# Patient Record
Sex: Female | Born: 1990 | Race: White | Hispanic: No | Marital: Single | Smoking: Never smoker
Health system: Southern US, Community
[De-identification: ages and names within clinical notes are randomized; demographics above are authoritative.]

## PROBLEM LIST (undated history)

## (undated) DIAGNOSIS — R030 Elevated blood-pressure reading, without diagnosis of hypertension: Secondary | ICD-10-CM

## (undated) DIAGNOSIS — R002 Palpitations: Secondary | ICD-10-CM

## (undated) DIAGNOSIS — R079 Chest pain, unspecified: Secondary | ICD-10-CM

## (undated) DIAGNOSIS — F419 Anxiety disorder, unspecified: Secondary | ICD-10-CM

## (undated) DIAGNOSIS — IMO0001 Reserved for inherently not codable concepts without codable children: Secondary | ICD-10-CM

## (undated) DIAGNOSIS — F329 Major depressive disorder, single episode, unspecified: Secondary | ICD-10-CM

## (undated) DIAGNOSIS — F32A Depression, unspecified: Secondary | ICD-10-CM

## (undated) HISTORY — DX: Major depressive disorder, single episode, unspecified: F32.9

## (undated) HISTORY — DX: Chest pain, unspecified: R07.9

## (undated) HISTORY — DX: Elevated blood-pressure reading, without diagnosis of hypertension: R03.0

## (undated) HISTORY — DX: Depression, unspecified: F32.A

## (undated) HISTORY — DX: Reserved for inherently not codable concepts without codable children: IMO0001

## (undated) HISTORY — DX: Anxiety disorder, unspecified: F41.9

## (undated) HISTORY — DX: Palpitations: R00.2

---

## 2011-09-05 ENCOUNTER — Ambulatory Visit (INDEPENDENT_AMBULATORY_CARE_PROVIDER_SITE_OTHER): Payer: BC Managed Care – PPO | Admitting: Emergency Medicine

## 2011-09-05 ENCOUNTER — Ambulatory Visit: Payer: BC Managed Care – PPO

## 2011-09-05 VITALS — BP 142/98 | HR 67 | Temp 97.8°F | Resp 18 | Ht 65.5 in | Wt 138.8 lb

## 2011-09-05 DIAGNOSIS — M25531 Pain in right wrist: Secondary | ICD-10-CM

## 2011-09-05 DIAGNOSIS — M25539 Pain in unspecified wrist: Secondary | ICD-10-CM

## 2011-09-05 DIAGNOSIS — I10 Essential (primary) hypertension: Secondary | ICD-10-CM

## 2011-09-05 DIAGNOSIS — IMO0001 Reserved for inherently not codable concepts without codable children: Secondary | ICD-10-CM

## 2011-09-05 MED ORDER — MELOXICAM 15 MG PO TABS: 15.0000 mg | ORAL_TABLET | Freq: Every day | ORAL | Status: AC

## 2011-09-05 NOTE — Progress Notes (Signed)
  Subjective:    Patient ID: Gloria Robinson, female    DOB: 07-24-1991, 21 y.o.   MRN: [id number]  HPI patient enters with the chief complaint of pain in her right wrist. Patient has recently started [employer] school and has been using her hands repetitively through the day. She has pain on the ulnar side of her right wrist and some decreased grip strength in that wrist he    Review of Systems she has no other musculoskeletal complaints.     Objective:   Physical Exam physical exam reveals tenderness over the ulnar styloid. She does not have pain with flexion extension or ulnar deviation against resistance. There does appear to be some swelling over the soft tissues distal to the ulnar styloid   [facility] reading (PRIMARY) by  Dr. Cleta Alberts normal.      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 We'll get an x-ray to confirm a normal ulnar styloid. She will probably benefit from nonsteroidals and splint also of note the patient's blood pressure was elevated on her initial vital signs and on repeat. We'll also address this at the time of checkout.

## 2011-09-05 NOTE — Patient Instructions (Addendum)
Wrist Exercises RANGE OF MOTION (ROM) AND STRETCHING EXERCISES - Wrist Sprain  These exercises may help you when beginning to rehabilitate your injury. Your symptoms may resolve with or without further involvement from your physician, physical therapist or athletic trainer. While completing these exercises, remember:    Restoring tissue flexibility helps normal motion to return to the joints. This allows healthier, less painful movement and activity.     An effective stretch should be held for at least 30 seconds.     A stretch should never be painful. You should only feel a gentle lengthening or release in the stretched tissue.  RANGE OF MOTION - Wrist Flexion, Active-Assisted  Extend your right / left elbow with your palm pointing down.*     Gently pull the back of your hand towards you until you feel a gentle stretch on the top of your forearm.     Hold this position for __________ seconds.  Repeat __________ times. Complete this exercise __________ times per day.   *If directed by your physician, physical therapist or athletic trainer, complete this stretch with your elbow bent rather than extended. RANGE OF MOTION - Wrist Extension, Active-Assisted   Extend your right / left elbow and turn your palm upwards.*     Gently pull your palm/fingertips back so your wrist extends and your fingers point more toward the ground.     You should feel a gentle stretch on the inside of your forearm.     Hold this position for __________ seconds.  Repeat __________ times. Complete this exercise __________ times per day. *If directed by your physician, physical therapist or athletic trainer, complete this stretch with your elbow bent, rather than extended. RANGE OF MOTION - Supination, Active   Stand or sit with your elbows at your side. Bend your right / left elbow to 90 degrees.     Turn your palm upward until you feel a gentle stretch on the inside of your forearm.     Hold this position  for __________ seconds. Slowly release and return to the starting position.  Repeat __________ times. Complete this stretch __________ times per day.   RANGE OF MOTION - Pronation, Active   Stand or sit with your elbows at your side. Bend your right / left elbow to 90 degrees.     Turn your palm downward until you feel a gentle stretch on the top of your forearm.     Hold this position for __________ seconds. Slowly release and return to the starting position.  Repeat __________ times. Complete this stretch __________ times per day.   STRENGTHENING EXERCISES  These exercises may help you when beginning to rehabilitate your injury. They may resolve your symptoms with or without further involvement from your physician, physical therapist or athletic trainer. While completing these exercises, remember:    Muscles can gain both the endurance and the strength needed for everyday activities through controlled exercises.     Complete these exercises as instructed by your physician, physical therapist or athletic trainer. Progress the resistance and repetitions only as guided.     You may experience muscle soreness or fatigue, but the pain or discomfort you are trying to eliminate should never worsen during these exercises. If this pain does worsen, stop and make certain you are following the directions exactly. If the pain is still present after adjustments, discontinue the exercise until you can discuss the trouble with your clinician.  STRENGTH - Wrist Flexors  Sit with your right /  left forearm palm-up and fully supported. Your elbow should be resting below the height of your shoulder. Allow your wrist to extend over the edge of the surface.     Loosely holding a __________ weight or a piece of rubber exercise band/tubing, slowly curl your hand up toward your forearm.     Hold this position for __________ seconds. Slowly lower the wrist back to the starting position in a controlled manner.    Repeat __________ times. Complete this exercise __________ times per day.   STRENGTH - Wrist Extensors  Sit with your right / left forearm palm-down and fully supported. Your elbow should be resting below the height of your shoulder. Allow your wrist to extend over the edge of the surface.     Loosely holding a __________ weight or a piece of rubber exercise band/tubing, slowly curl your handup toward your forearm.     Hold this position for __________ seconds. Slowly lower the wrist back to the starting position in a controlled manner.  Repeat __________ times. Complete this exercise __________ times per day.   STRENGTH - Forearm Supinators  Sit with your right / left forearm supported on a table, keeping your elbow below shoulder height. Rest your hand over the edge, palm down.     Gently grip a hammer or a soup ladle.     Without moving your elbow, slowly turn your palm and hand upward to a "thumbs-up" position.     Hold this position for __________ seconds. Slowly return to the starting position.  Repeat __________ times. Complete this exercise __________ times per day.   STRENGTH - Forearm Pronators   Sit with your right / left forearm supported on a table, keeping your elbow below shoulder height. Rest your hand over the edge, palm up.     Gently grip a hammer or a soup ladle.     Without moving your elbow, slowly turn your palm and hand upward to a "thumbs-up" position.     Hold this position for __________ seconds. Slowly return to the starting position.  Repeat __________ times. Complete this exercise __________ times per day.   STRENGTH - Grip  Grasp a tennis ball, a dense sponge, or a large, rolled sock in your hand.     Squeeze as hard as you can without increasing any pain.     Hold this position for __________ seconds. Release your grip slowly.  Repeat __________ times. Complete this exercise __________ times per day.   Document Released: 06/02/2005 Document  Revised: 03/31/2011 Document Reviewed: 10/31/2008 [facility] Patient Information 2012 [place], [place].Hypertension Information As your heart beats, it forces blood through your arteries. This force is your blood pressure. If the pressure is too high, it is called hypertension (HTN) or high blood pressure. HTN is dangerous because you may have it and not know it. High blood pressure may mean that your heart has to work harder to pump blood. Your arteries may be narrow or stiff. The extra work puts you at risk for heart disease, stroke, and other problems.   Blood pressure consists of two numbers, a higher number over a lower, 110/72, for example. It is stated as "110 over 72." The ideal is below 120 for the top number (systolic) and under 80 for the bottom (diastolic).   You should pay close attention to your blood pressure if you have certain conditions such as:  Heart failure.     Prior heart attack.     Diabetes  Chronic kidney disease.     Prior stroke.     Multiple risk factors for heart disease.  To see if you have HTN, your blood pressure should be measured while you are seated with your arm held at the level of the heart. It should be measured at least twice. A one-time elevated blood pressure reading (especially in the Emergency Department) does not mean that you need treatment. There may be conditions in which the blood pressure is different between your right and left arms. It is important to see your caregiver soon for a recheck. Most people have essential hypertension which means that there is not a specific cause. This type of high blood pressure may be lowered by changing lifestyle factors such as:  Stress.     Smoking.    Lack of exercise.     Excessive weight.     Drug/tobacco/alcohol use.     Eating less salt.  Most people do not have symptoms from high blood pressure until it has caused damage to the body. Effective treatment can often prevent, delay or reduce that  damage. TREATMENT   Treatment for high blood pressure, when a cause has been identified, is directed at the cause. There are a large number of medications to treat HTN. These fall into several categories, and your caregiver will help you select the medicines that are best for you. Medications may have side effects. You should review side effects with your caregiver. If your blood pressure stays high after you have made lifestyle changes or started on medicines,    Your medication(s) may need to be changed.     Other problems may need to be addressed.     Be certain you understand your prescriptions, and know how and when to take your medicine.     Be sure to follow up with your caregiver within the time frame advised (usually within two weeks) to have your blood pressure rechecked and to review your medications.     If you are taking more than one medicine to lower your blood pressure, make sure you know how and at what times they should be taken. Taking two medicines at the same time can result in blood pressure that is too low.  Document Released: 09/21/2005 Document Revised: 03/31/2011 Document Reviewed: 09/28/2007 [facility] Patient Information 2012 [place], [place].Wrist Pain Wrist injuries are frequent in adults and children. A sprain is an injury to the ligaments that hold your bones together. A strain is an injury to muscle or muscle cord-like structures (tendons) from stretching or pulling. Generally, when wrists are moderately tender to touch following a fall or injury, a break in the bone (fracture) may be present. Most wrist sprains or strains are better in 3 to 5 days, but complete healing may take several weeks. HOME CARE INSTRUCTIONS    Put ice on the injured area.     Put ice in a plastic bag.     Place a towel between your skin and the bag.     Leave the ice on for 15 to 20 minutes, 3 to 4 times a day, for the first 2 days.     Keep your arm raised above the level of your  heart whenever possible to reduce swelling and pain.     Rest the injured area for at least 48 hours or as directed by your caregiver.     If a splint or elastic bandage has been applied, use it for as long as directed  by your caregiver or until seen by a caregiver for a follow-up exam.     Only take over-the-counter or prescription medicines for pain, discomfort, or fever as directed by your caregiver.     Keep all follow-up appointments. You may need to follow up with a specialist or have follow-up X-rays. Improvement in pain level is not a guarantee that you did not fracture a bone in your wrist. The only way to determine whether or not you have a broken bone is by X-ray.  SEEK IMMEDIATE MEDICAL CARE IF:    Your fingers are swollen, very red, white, or cold and blue.     Your fingers are numb or tingling.     You have increasing pain.     You have difficulty moving your fingers.  MAKE SURE YOU:    Understand these instructions.     Will watch your condition.     Will get help right away if you are not doing well or get worse.  Document Released: 04/28/2005 Document Revised: 03/31/2011 Document Reviewed: 09/09/2010 [facility] Patient Information 2012 [place], [place]. Hypertension As your heart beats, it forces blood through your arteries. This force is your blood pressure. If the pressure is too high, it is called hypertension (HTN) or high blood pressure. HTN is dangerous because you may have it and not know it. High blood pressure may mean that your heart has to work harder to pump blood. Your arteries may be narrow or stiff. The extra work puts you at risk for heart disease, stroke, and other problems.  Blood pressure consists of two numbers, a higher number over a lower, 110/72, for example. It is stated as "110 over 72." The ideal is below 120 for the top number (systolic) and under 80 for the bottom (diastolic). Write down your blood pressure today. You should pay close attention  to your blood pressure if you have certain conditions such as:  Heart failure.   Prior heart attack.   Diabetes   Chronic kidney disease.   Prior stroke.   Multiple risk factors for heart disease.  To see if you have HTN, your blood pressure should be measured while you are seated with your arm held at the level of the heart. It should be measured at least twice. A one-time elevated blood pressure reading (especially in the Emergency Department) does not mean that you need treatment. There may be conditions in which the blood pressure is different between your right and left arms. It is important to see your caregiver soon for a recheck. Most people have essential hypertension which means that there is not a specific cause. This type of high blood pressure may be lowered by changing lifestyle factors such as:  Stress.   Smoking.   Lack of exercise.   Excessive weight.   Drug/tobacco/alcohol use.   Eating less salt.  Most people do not have symptoms from high blood pressure until it has caused damage to the body. Effective treatment can often prevent, delay or reduce that damage. TREATMENT  When a cause has been identified, treatment for high blood pressure is directed at the cause. There are a large number of medications to treat HTN. These fall into several categories, and your caregiver will help you select the medicines that are best for you. Medications may have side effects. You should review side effects with your caregiver. If your blood pressure stays high after you have made lifestyle changes or started on medicines,   Your medication(s)  may need to be changed.   Other problems may need to be addressed.   Be certain you understand your prescriptions, and know how and when to take your medicine.   Be sure to follow up with your caregiver within the time frame advised (usually within two weeks) to have your blood pressure rechecked and to review your medications.   If  you are taking more than one medicine to lower your blood pressure, make sure you know how and at what times they should be taken. Taking two medicines at the same time can result in blood pressure that is too low.  SEEK IMMEDIATE MEDICAL CARE IF:  You develop a severe headache, blurred or changing vision, or confusion.   You have unusual weakness or numbness, or a faint feeling.   You have severe chest or abdominal pain, vomiting, or breathing problems.  MAKE SURE YOU:   Understand these instructions.   Will watch your condition.   Will get help right away if you are not doing well or get worse.  Document Released: 07/19/2005 Document Revised: 03/31/2011 Document Reviewed: 03/08/2008 [facility] Patient Information 2012 [place], [place].

## 2012-10-16 ENCOUNTER — Ambulatory Visit (INDEPENDENT_AMBULATORY_CARE_PROVIDER_SITE_OTHER): Payer: BC Managed Care – PPO | Admitting: Internal Medicine

## 2012-10-16 ENCOUNTER — Encounter: Payer: Self-pay | Admitting: *Deleted

## 2012-10-16 ENCOUNTER — Ambulatory Visit: Payer: BC Managed Care – PPO

## 2012-10-16 VITALS — BP 142/80 | HR 73 | Temp 98.6°F | Resp 16 | Ht 65.0 in | Wt 144.2 lb

## 2012-10-16 DIAGNOSIS — R079 Chest pain, unspecified: Secondary | ICD-10-CM

## 2012-10-16 DIAGNOSIS — R5383 Other fatigue: Secondary | ICD-10-CM

## 2012-10-16 DIAGNOSIS — I498 Other specified cardiac arrhythmias: Secondary | ICD-10-CM

## 2012-10-16 DIAGNOSIS — R001 Bradycardia, unspecified: Secondary | ICD-10-CM

## 2012-10-16 DIAGNOSIS — R5381 Other malaise: Secondary | ICD-10-CM

## 2012-10-16 DIAGNOSIS — R002 Palpitations: Secondary | ICD-10-CM

## 2012-10-16 LAB — POCT CBC
Granulocyte percent: 51 %G (ref 37–80)
HCT, POC: 44 % (ref 37.7–47.9)
Hemoglobin: 14.1 g/dL (ref 12.2–16.2)
MCHC: 32 g/dL (ref 31.8–35.4)
MPV: 9.3 fL (ref 0–99.8)
POC Granulocyte: 3.5 (ref 2–6.9)
POC LYMPH PERCENT: 41.4 %L (ref 10–50)
POC MID %: 7.6 %M (ref 0–12)
RDW, POC: 12.6 %

## 2012-10-16 LAB — COMPREHENSIVE METABOLIC PANEL
ALT: 16 U/L (ref 0–35)
Albumin: 4.6 g/dL (ref 3.5–5.2)
Alkaline Phosphatase: 81 U/L (ref 39–117)
Glucose, Bld: 94 mg/dL (ref 70–99)
Potassium: 4.2 mEq/L (ref 3.5–5.3)
Sodium: 139 mEq/L (ref 135–145)
Total Bilirubin: 0.4 mg/dL (ref 0.3–1.2)
Total Protein: 7.1 g/dL (ref 6.0–8.3)

## 2012-10-16 LAB — LIPID PANEL
Cholesterol: 211 mg/dL — ABNORMAL HIGH (ref 0–200)
LDL Cholesterol: 133 mg/dL — ABNORMAL HIGH (ref 0–99)
Total CHOL/HDL Ratio: 3.8 Ratio
VLDL: 23 mg/dL (ref 0–40)

## 2012-10-16 NOTE — Patient Instructions (Signed)
DASH Diet The DASH diet stands for "Dietary Approaches to Stop Hypertension." It is a healthy eating plan that has been shown to reduce high blood pressure (hypertension) in as little as 14 days, while also possibly providing other significant health benefits. These other health benefits include reducing the risk of breast cancer after menopause and reducing the risk of type 2 diabetes, heart disease, colon cancer, and stroke. Health benefits also include weight loss and slowing kidney failure in patients with chronic kidney disease.  DIET GUIDELINES  Limit salt (sodium). Your diet should contain less than 1500 mg of sodium daily.  Limit refined or processed carbohydrates. Your diet should include mostly whole grains. Desserts and added sugars should be used sparingly.  Include small amounts of heart-healthy fats. These types of fats include nuts, oils, and tub margarine. Limit saturated and trans fats. These fats have been shown to be harmful in the body. CHOOSING FOODS  The following food groups are based on a 2000 calorie diet. See your Registered Dietitian for individual calorie needs. Grains and Grain Products (6 to 8 servings daily)  Eat More Often: Whole-wheat bread, brown rice, whole-grain or wheat pasta, quinoa, popcorn without added fat or salt (air popped).  Eat Less Often: White bread, white pasta, white rice, cornbread. Vegetables (4 to 5 servings daily)  Eat More Often: Fresh, frozen, and canned vegetables. Vegetables may be raw, steamed, roasted, or grilled with a minimal amount of fat.  Eat Less Often/Avoid: Creamed or fried vegetables. Vegetables in a cheese sauce. Fruit (4 to 5 servings daily)  Eat More Often: All fresh, canned (in natural juice), or frozen fruits. Dried fruits without added sugar. One hundred percent fruit juice ( cup [237 mL] daily).  Eat Less Often: Dried fruits with added sugar. Canned fruit in light or heavy syrup. [employer], Fish, and Poultry (2  servings or less daily. One serving is 3 to 4 oz [85-114 g]).  Eat More Often: Ninety percent or leaner ground beef, tenderloin, sirloin. Round cuts of beef, chicken breast, [place] breast. All fish. Grill, bake, or broil your meat. Nothing should be fried.  Eat Less Often/Avoid: Fatty cuts of meat, [place], or chicken leg, thigh, or wing. Fried cuts of meat or fish. Dairy (2 to 3 servings)  Eat More Often: Low-fat or fat-free milk, low-fat plain or light yogurt, reduced-fat or part-skim cheese.  Eat Less Often/Avoid: Milk (whole, 2%).Whole milk yogurt. Full-fat cheeses. Nuts, Seeds, and Legumes (4 to 5 servings per week)  Eat More Often: All without added salt.  Eat Less Often/Avoid: Salted nuts and seeds, canned beans with added salt. Fats and Sweets (limited)  Eat More Often: Vegetable oils, tub margarines without trans fats, sugar-free gelatin. Mayonnaise and salad dressings.  Eat Less Often/Avoid: Coconut oils, palm oils, butter, stick margarine, cream, half and half, cookies, candy, pie. FOR MORE INFORMATION The Dash Diet Eating Plan: www.dashdiet.org Document Released: 07/08/2011 Document Revised: 10/11/2011 Document Reviewed: 07/08/2011 [facility] Patient Information 2013 [place], [place]. Bradycardia Bradycardia is a term for a heart rate (pulse) that, in adults, is slower than 60 beats per minute. A normal rate is 60 to 100 beats per minute. A heart rate below 60 beats per minute may be normal for some adults with healthy hearts. If the rate is too slow, the heart may have trouble pumping the volume of blood the body needs. If the heart rate gets too low, blood flow to the brain may be decreased and may make you feel lightheaded, dizzy,  or faint. The heart has a natural pacemaker in the top of the heart called the SA node (sinoatrial or sinus node). This pacemaker sends out regular electrical signals to the muscle of the heart, telling the heart muscle when to beat (contract). The  electrical signal travels from the upper parts of the heart (atria) through the AV node (atrioventricular node), to the lower chambers of the heart (ventricles). The ventricles squeeze, pumping the blood from your heart to your lungs and to the rest of your body. CAUSES   Problem with the heart's electrical system.  Problem with the heart's natural pacemaker.  Heart disease, damage, or infection.  Medications.  Problems with minerals and salts (electrolytes). SYMPTOMS   Fainting (syncope).  Fatigue and weakness.  Shortness of breath (dyspnea).  Chest pain (angina).  Drowsiness.  Confusion. DIAGNOSIS   An electrocardiogram (ECG) can help your caregiver determine the type of slow heart rate you have.  If the cause is not seen on an ECG, you may need to wear a heart monitor that records your heart rhythm for several hours or days.  Blood tests. TREATMENT   Electrolyte supplements.  Medications.  Withholding medication which is causing a slow heart rate.  Pacemaker placement. SEEK IMMEDIATE MEDICAL CARE IF:   You feel lightheaded or faint.  You develop an irregular heart rate.  You feel chest pain or have trouble breathing. MAKE SURE YOU:   Understand these instructions.  Will watch your condition.  Will get help right away if you are not doing well or get worse. Document Released: 04/10/2002 Document Revised: 10/11/2011 Document Reviewed: 03/06/2008 [facility] Patient Information 2013 [place], [place]. Hypertension As your heart beats, it forces blood through your arteries. This force is your blood pressure. If the pressure is too high, it is called hypertension (HTN) or high blood pressure. HTN is dangerous because you may have it and not know it. High blood pressure may mean that your heart has to work harder to pump blood. Your arteries may be narrow or stiff. The extra work puts you at risk for heart disease, stroke, and other problems.  Blood pressure  consists of two numbers, a higher number over a lower, 110/72, for example. It is stated as "110 over 72." The ideal is below 120 for the top number (systolic) and under 80 for the bottom (diastolic). Write down your blood pressure today. You should pay close attention to your blood pressure if you have certain conditions such as:  Heart failure.  Prior heart attack.  Diabetes  Chronic kidney disease.  Prior stroke.  Multiple risk factors for heart disease. To see if you have HTN, your blood pressure should be measured while you are seated with your arm held at the level of the heart. It should be measured at least twice. A one-time elevated blood pressure reading (especially in the Emergency Department) does not mean that you need treatment. There may be conditions in which the blood pressure is different between your right and left arms. It is important to see your caregiver soon for a recheck. Most people have essential hypertension which means that there is not a specific cause. This type of high blood pressure may be lowered by changing lifestyle factors such as:  Stress.  Smoking.  Lack of exercise.  Excessive weight.  Drug/tobacco/alcohol use.  Eating less salt. Most people do not have symptoms from high blood pressure until it has caused damage to the body. Effective treatment can often prevent, delay or  reduce that damage. TREATMENT  When a cause has been identified, treatment for high blood pressure is directed at the cause. There are a large number of medications to treat HTN. These fall into several categories, and your caregiver will help you select the medicines that are best for you. Medications may have side effects. You should review side effects with your caregiver. If your blood pressure stays high after you have made lifestyle changes or started on medicines,   Your medication(s) may need to be changed.  Other problems may need to be addressed.  Be certain you  understand your prescriptions, and know how and when to take your medicine.  Be sure to follow up with your caregiver within the time frame advised (usually within two weeks) to have your blood pressure rechecked and to review your medications.  If you are taking more than one medicine to lower your blood pressure, make sure you know how and at what times they should be taken. Taking two medicines at the same time can result in blood pressure that is too low. SEEK IMMEDIATE MEDICAL CARE IF:  You develop a severe headache, blurred or changing vision, or confusion.  You have unusual weakness or numbness, or a faint feeling.  You have severe chest or abdominal pain, vomiting, or breathing problems. MAKE SURE YOU:   Understand these instructions.  Will watch your condition.  Will get help right away if you are not doing well or get worse. Document Released: 07/19/2005 Document Revised: 10/11/2011 Document Reviewed: 03/08/2008 [facility] Patient Information 2013 [place], [place].

## 2012-10-16 NOTE — Progress Notes (Signed)
  Subjective:    Patient ID: Gloria Robinson, female    DOB: [birth year], 22 y.o.   MRN: [id number]  HPI C/O off and on chest pain with palpitations , diaphoresis, and arm numbness. Spells started while jogging but now come at rest and usually last up to 5 minutes. No dizzyness, blackouts, nausea,or weakness. Is able to continue jogging. 1st attack occurred 1 week ago. Has been jogging or 3 years. No fhx of sudden death, GF died of MI, was older. No young people sudden death or heart disease. FHX of HTN and she has had 2 visits with mild systolic elevation   Review of Systems Depression and BP and anxiety    Objective:   Physical Exam  Vitals reviewed. Constitutional: She is oriented to person, place, and time. She appears well-developed and well-nourished. No distress.  HENT:  Mouth/Throat: Oropharynx is clear and moist.  Eyes: Conjunctivae and EOM are normal. Pupils are equal, round, and reactive to light.  Neck: Normal range of motion. No thyromegaly present.  Cardiovascular: Regular rhythm and normal heart sounds.  Bradycardia present.   Pulmonary/Chest: Effort normal and breath sounds normal.  Abdominal: Soft.  Musculoskeletal: Normal range of motion.  Lymphadenopathy:    She has no cervical adenopathy.  Neurological: She is alert and oriented to person, place, and time. No cranial nerve deficit. Coordination normal.  Skin: Skin is warm and dry.  Psychiatric: She has a normal mood and affect.   1240/84  128/80 EKG sinus bradycardia at 43 Exercised and rate rose to 118 and slowly came down.   [facility] reading (PRIMARY) by  Dr.Guest no pneumothorax, normal exam Labs drawn       Assessment & Plan:  New chest pain/diaphoresis, Sinus bradycardia, short PR interval Refer for cardiac evaluation

## 2012-11-10 ENCOUNTER — Ambulatory Visit: Payer: Self-pay | Admitting: Cardiology

## 2012-11-30 ENCOUNTER — Encounter: Payer: Self-pay | Admitting: Cardiology

## 2012-11-30 ENCOUNTER — Encounter: Payer: Self-pay | Admitting: *Deleted

## 2012-11-30 DIAGNOSIS — R079 Chest pain, unspecified: Secondary | ICD-10-CM | POA: Insufficient documentation

## 2012-11-30 DIAGNOSIS — R002 Palpitations: Secondary | ICD-10-CM | POA: Insufficient documentation

## 2012-11-30 DIAGNOSIS — F419 Anxiety disorder, unspecified: Secondary | ICD-10-CM | POA: Insufficient documentation

## 2012-11-30 DIAGNOSIS — F32A Depression, unspecified: Secondary | ICD-10-CM | POA: Insufficient documentation

## 2012-11-30 DIAGNOSIS — F329 Major depressive disorder, single episode, unspecified: Secondary | ICD-10-CM | POA: Insufficient documentation

## 2012-12-04 ENCOUNTER — Encounter: Payer: Self-pay | Admitting: Cardiology

## 2012-12-04 NOTE — Progress Notes (Signed)
  HPI: 22 year old female for evaluation of palpitations. Laboratories in March of 2014 showed a normal hemoglobin, normal potassium, normal renal function, normal liver functions, and TSH of 1.557. Chest x-ray negative.  No current outpatient prescriptions on file.   No current facility-administered medications for this visit.    No Known Allergies  Past Medical History  Diagnosis Date  . Anxiety   . Depression   . Elevated BP   . Chest pain   . Palpitations     No past surgical history on file.  History   Social History  . Marital Status: Single    Spouse Name: N/A    Number of Children: N/A  . Years of Education: N/A   Occupational History  . Not on file.   Social History Main Topics  . Smoking status: Never Smoker   . Smokeless tobacco: Not on file  . Alcohol Use: 2.4 oz/week    4 Cans of beer per week  . Drug Use: No  . Sexually Active: Yes    Birth Control/ Protection: None   Other Topics Concern  . Not on file   Social History Narrative  . No narrative on file    Family History  Problem Relation Age of Onset  . Hypertension Father   . Hypertension Sister   . Heart disease Paternal Grandfather     ROS: no fevers or chills, productive cough, hemoptysis, dysphasia, odynophagia, melena, hematochezia, dysuria, hematuria, rash, seizure activity, orthopnea, PND, pedal edema, claudication. Remaining systems are negative.  Physical Exam:   There were no vitals taken for this visit.  General:  Well developed/well nourished in NAD Skin warm/dry Patient not depressed No peripheral clubbing Back-normal HEENT-normal/normal eyelids Neck supple/normal carotid upstroke bilaterally; no bruits; no JVD; no thyromegaly chest - CTA/ normal expansion CV - RRR/normal S1 and S2; no murmurs, rubs or gallops;  PMI nondisplaced Abdomen -NT/ND, no HSM, no mass, + bowel sounds, no bruit 2+ femoral pulses, no bruits Ext-no edema, chords, 2+ DP Neuro-grossly  nonfocal  ECG 10/16/2012-marked sinus bradycardia at a rate of 43. No ST changes.   This encounter was created in error - please disregard.

## 2013-01-24 IMAGING — CR DG WRIST COMPLETE 3+V*R*
3 series · 3 of 3 positions shown · non-contrast
Comparison: None.

CLINICAL DATA: Right wrist pain.

RIGHT WRIST - COMPLETE 3+ VIEW

[PA]
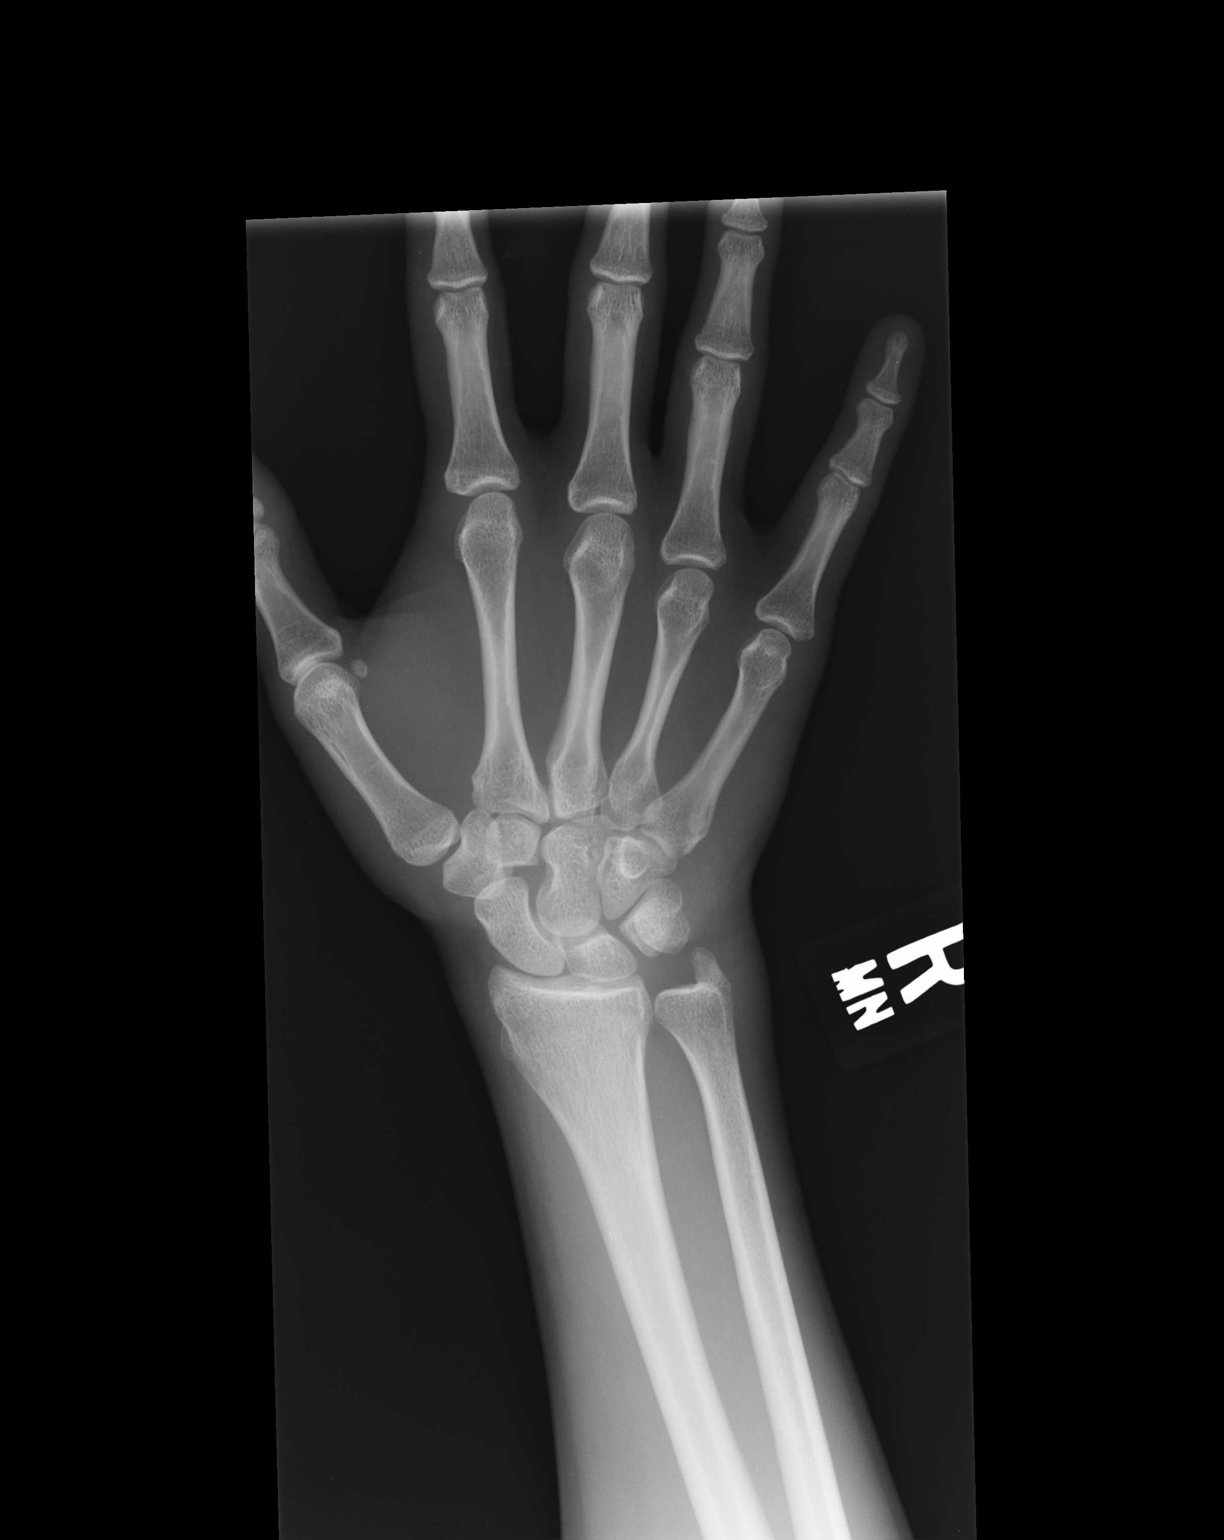

[lateral]
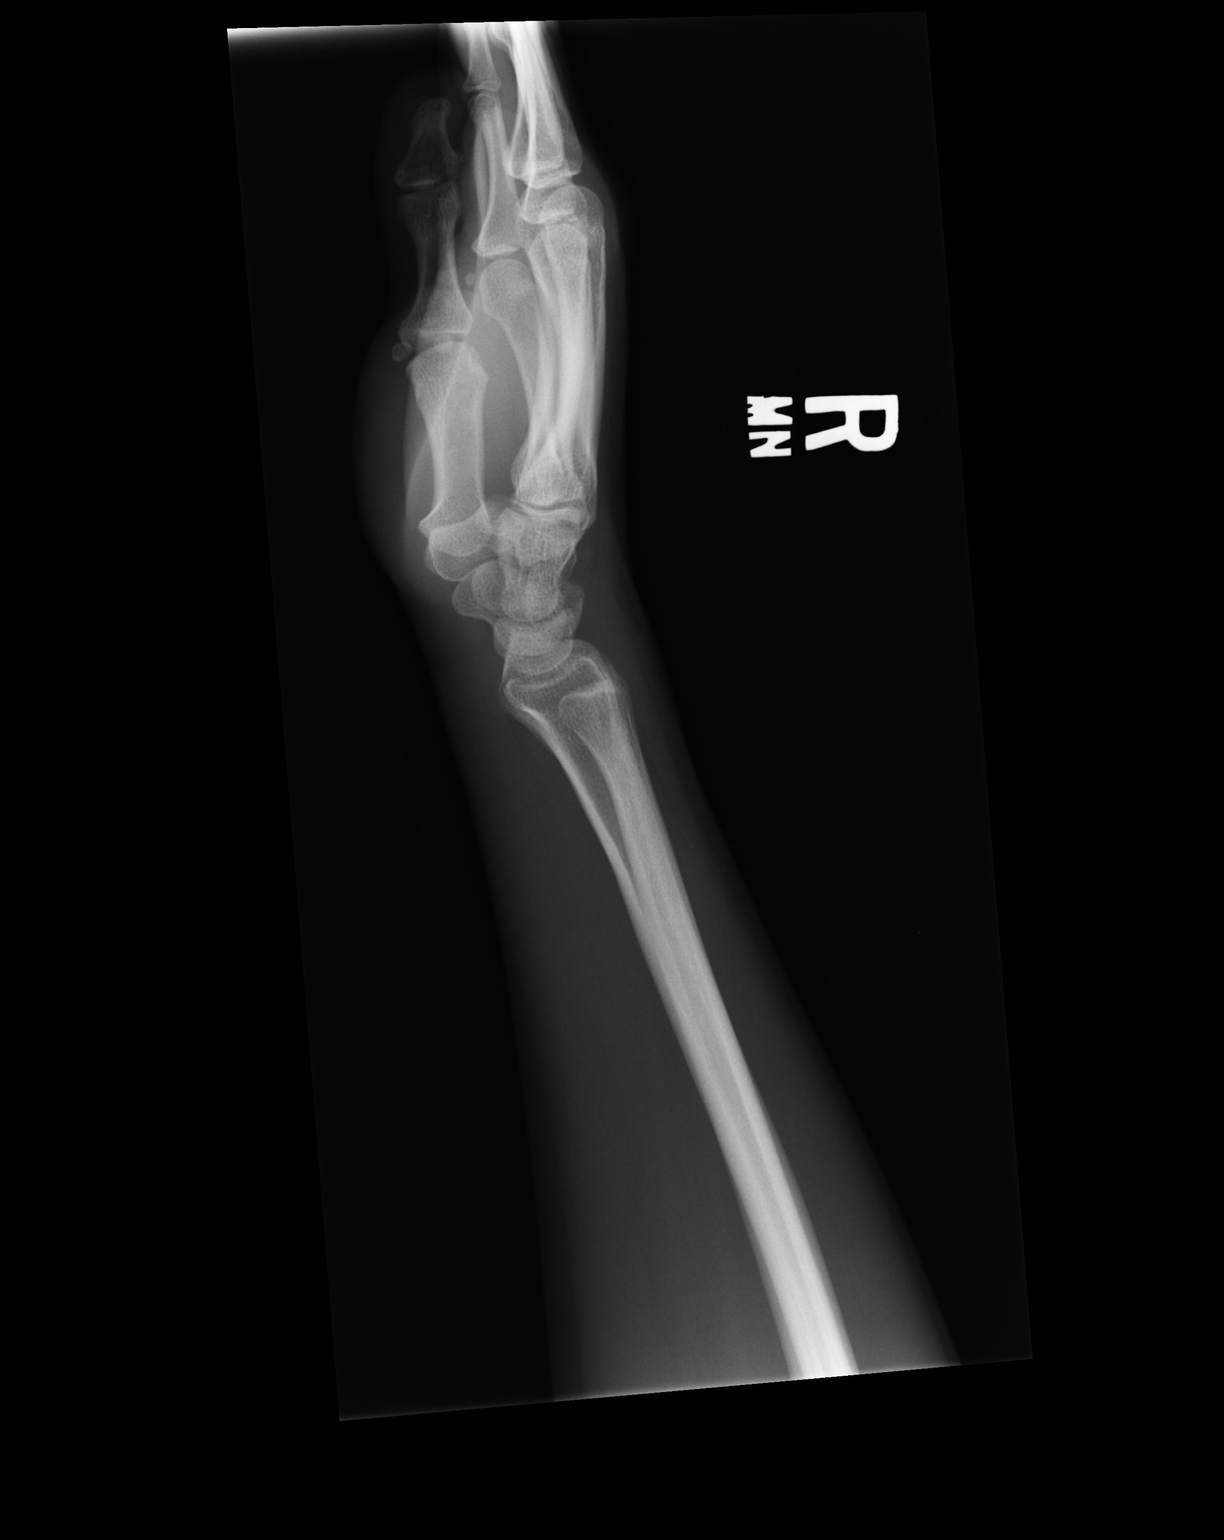

[ap ext rot]
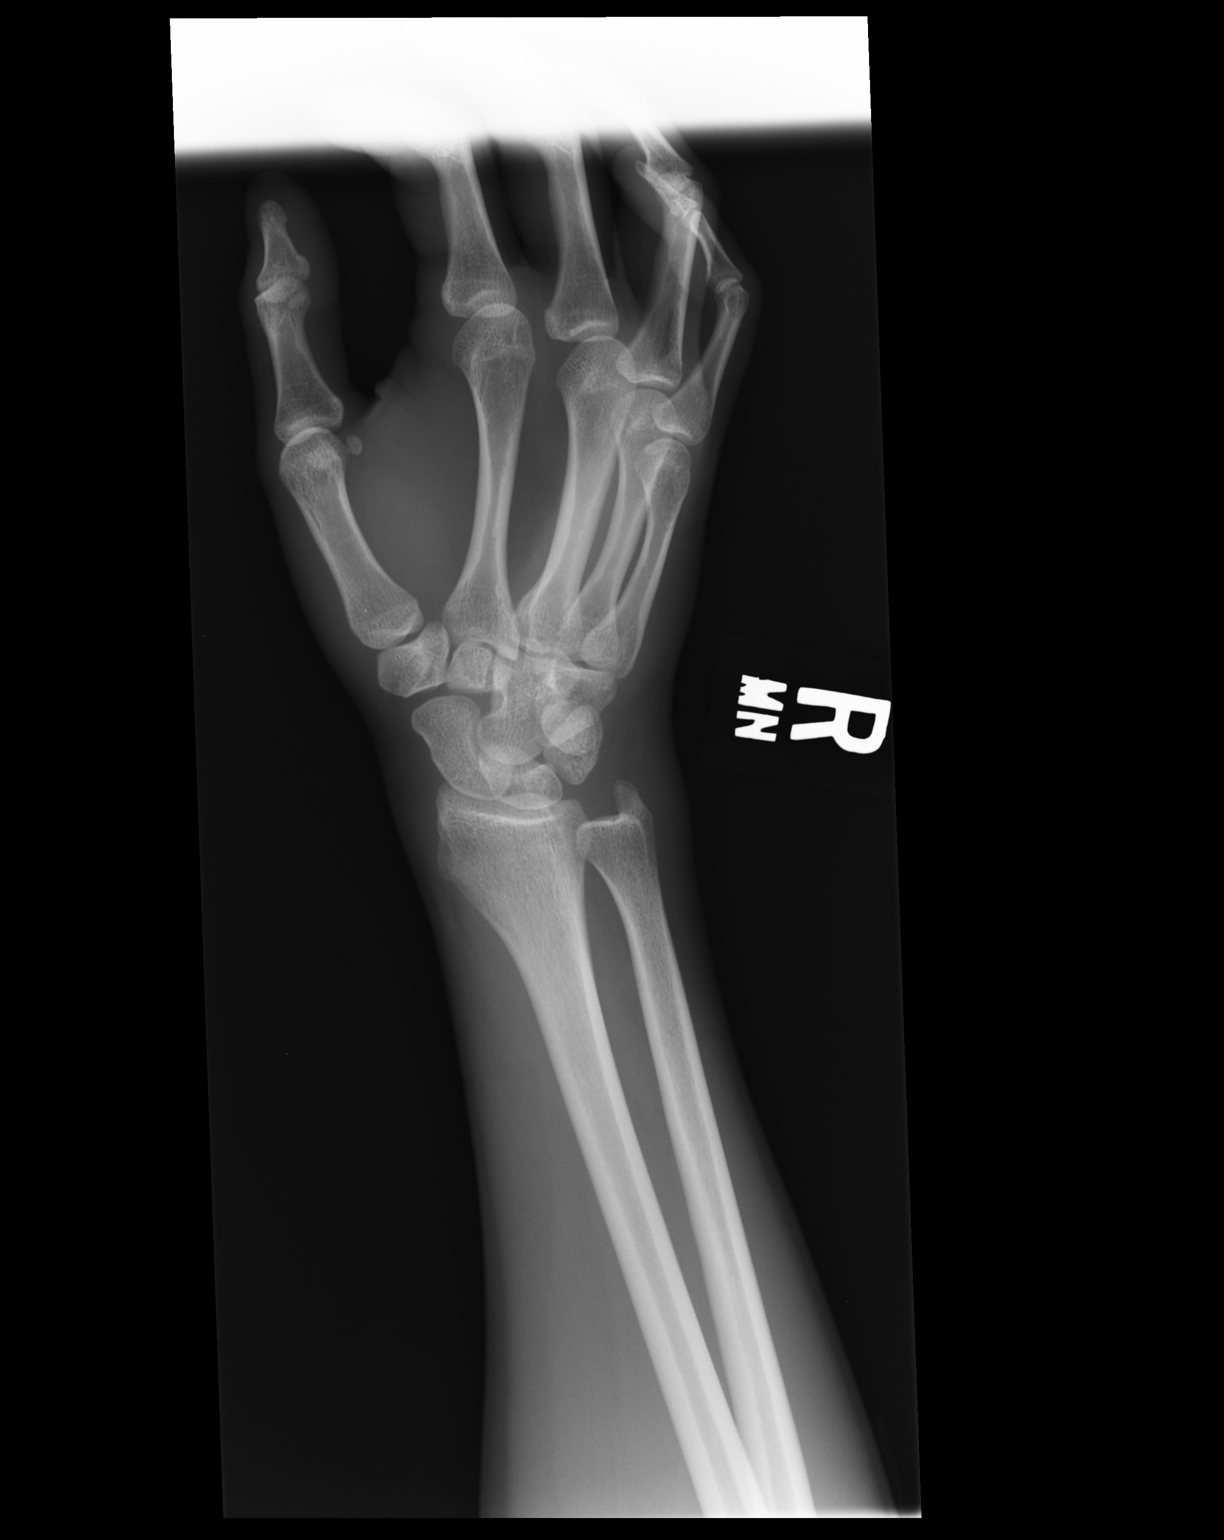

[3 of 3 positions shown; findings below may reference images not displayed]

FINDINGS: Anatomic alignment.  No fracture.  Soft tissues appear
within normal limits.
IMPRESSION: Negative.

## 2022-02-09 ENCOUNTER — Ambulatory Visit (HOSPITAL_COMMUNITY)
Admission: RE | Admit: 2022-02-09 | Discharge: 2022-02-09 | Disposition: A | Discharge status: Home / Self Care | Payer: Self-pay | Attending: Psychiatry | Admitting: Psychiatry

## 2022-02-09 MED ORDER — CLONIDINE HCL 0.1 MG PO TABS: 0.1000 mg | ORAL_TABLET | Freq: Two times a day (BID) | ORAL | 0 refills | Status: AC

## 2022-02-09 MED ORDER — HYDROXYZINE HCL 10 MG PO TABS: 10.0000 mg | ORAL_TABLET | Freq: Three times a day (TID) | ORAL | 0 refills | Status: AC | PRN

## 2022-02-09 NOTE — H&P (Signed)
Behavioral Health Medical Screening Exam  Gloria Robinson is a 31 y.o. female with a past psychiatric history of MDD and anxiety, who presented for evaluation due to increased anxiety and alcohol use. Pt is accompanied by her sister Gloria Robinson.   PT  reports  " I'm just having like anxiety issues, poor self care and poor appetite". " I have not eaten anything reasonable for three days because when I'm anxious, I feel nauseous".  Pt reports she became very anxious because she is having " some issues" with her boyfriend, but they are working on it". Pt declined to elaborate.  Pt reports she has also been drinking a lot of alcohol for the past few days. Pt reports she drinks about 1/5 of liquor daily and about three to four cans of beer/day. Pt reports she also smokes very little amount of weed daily. Pt reports she vapes daily as well. Pt denies other illicit drug use. Pt denies nicotine use. Pt reports she was seen by a psychiatrist a long time ago and diagnosed with anxiety. Pt reports she was put on an antianxiety medication then, but she can't remember the name now. Pt reports her sleep and appetite as poor. Pt currently does not see a psychiatrist or therapist. Pt do not currently take any medications for her anxiety. Pt reports her blood pressure is high whenever she is anxious as well as being diagnosed with hypertension a long time ago. Pt reports she does not take any medications for HTN or see a PCP.   Pt reports her anxiety 10/10, with 10 being the worst she had felt. Pt reports she lives with her boyfriend, but her sister is supportive and helps her a lot.   Supportive therapy provided about ongoing stressors, and encouragement provided. Pt provided opportunity for questions. Pt denies SI/HI/AVH. Pt and her sister are requesting for outpatient psychiatric and/or therapy services.  On evaluation, patient is alert, oriented x 3, and cooperative. Speech is clear, coherent and logical. Pt appears  well casual. Eye contact is good. Mood is anxious, affect is congruent with mood. Thought process is logical and thought content is coherent. Pt denies SI/HI/AVH. There is no indication that the patient is responding to internal stimuli. No delusions elicited during this assessment.    Total Time spent with patient: 20 minutes  Psychiatric Specialty Exam:  Presentation  General Appearance: Casual  Eye Contact:Good  Speech:Clear and Coherent  Speech Volume:Normal  Handedness:Right   Mood and Affect  Mood:Anxious  Affect:Congruent   Thought Process  Thought Processes:Coherent  Descriptions of Associations:Intact  Orientation:Full (Time, Place and Person)  Thought Content:Logical  History of Schizophrenia/Schizoaffective disorder:No data recorded Duration of Psychotic Symptoms:No data recorded Hallucinations:Hallucinations: None  Ideas of Reference:None  Suicidal Thoughts:Suicidal Thoughts: No  Homicidal Thoughts:Homicidal Thoughts: No   Sensorium  Memory:Immediate Good; Recent Good  Judgment:Fair  Insight:Fair   Executive Functions  Concentration:Fair  Attention Span:Fair  Recall:Fair  Fund of Knowledge:Fair  Language:Fair   Psychomotor Activity  Psychomotor Activity:Psychomotor Activity: Normal   Assets  Assets:Communication Skills; Desire for Improvement; Transportation; Social Support   Sleep  Sleep:Sleep: Poor    Physical Exam: Physical Exam Constitutional:      Appearance: She is not toxic-appearing or diaphoretic.  HENT:     Head: Normocephalic.     Right Ear: External ear normal.     Left Ear: External ear normal.     Nose: No congestion.  Eyes:     General:  Right eye: No discharge.        Left eye: No discharge.  Cardiovascular:     Rate and Rhythm: Normal rate.  Pulmonary:     Effort: No respiratory distress.  Chest:     Chest wall: No tenderness.  Neurological:     Mental Status: She is alert and oriented  to person, place, and time.  Psychiatric:        Attention and Perception: Attention and perception normal.        Mood and Affect: Mood is anxious. Mood is not depressed.        Speech: Speech normal.        Behavior: Behavior is cooperative.        Thought Content: Thought content is not paranoid or delusional. Thought content does not include homicidal or suicidal ideation. Thought content does not include homicidal or suicidal plan.        Cognition and Memory: Cognition normal.        Judgment: Judgment normal.    Review of Systems  Constitutional:  Negative for chills, diaphoresis and fever.  HENT:  Negative for congestion.   Eyes:  Negative for pain and discharge.  Respiratory:  Negative for cough, shortness of breath and wheezing.   Cardiovascular:  Negative for chest pain and palpitations.  Gastrointestinal:  Negative for diarrhea, nausea and vomiting.  Neurological:  Negative for dizziness, seizures, loss of consciousness, weakness and headaches.  Psychiatric/Behavioral:  Positive for substance abuse. Negative for depression, hallucinations and suicidal ideas. The patient is nervous/anxious.     Musculoskeletal: Strength & Muscle Tone: within normal limits Gait & Station: normal Patient leans: N/A   Recommendations:  Based on my evaluation the patient does not appear to have an emergency medical condition.  Recommend follow up with outpatient psychiatric and therapy services.  Pt is provided outpatient psychiatry and therapy resources for medication management, substance abuse tx/counseling and therapy services.  Medications sent to pharmacy Clonidine Hcl 0.1 mg Po tabs BID x 5 days for hypertension Hydroxyzine (atarax) 10 mg PO TID prn 5 days for anxiety.   Pt educated on antianxiety and antihypertensive indications for use, benefits, and side effects. Pt given opportunity for questions.  Patient does not meet criteria for in-patient psychiatric admission at this  time. There is no imminent risk of harm to self or others.  Discussed crisis plan, calling emergency (911) or going to the ED as needed.  Pt discharged safely home accompanied by her sister Gloria Robinson. Condition at discharge is stable  Mancel Bale, NP 02/09/2022, 4:22 AM

## 2022-11-29 ENCOUNTER — Other Ambulatory Visit: Payer: Self-pay

## 2022-11-29 ENCOUNTER — Emergency Department (HOSPITAL_BASED_OUTPATIENT_CLINIC_OR_DEPARTMENT_OTHER)
Admission: EM | Admit: 2022-11-29 | Discharge: 2022-11-29 | Disposition: A | Discharge status: Home / Self Care | Payer: Self-pay | Attending: Emergency Medicine | Admitting: Emergency Medicine

## 2022-11-29 ENCOUNTER — Encounter (HOSPITAL_BASED_OUTPATIENT_CLINIC_OR_DEPARTMENT_OTHER): Payer: Self-pay

## 2022-11-29 DIAGNOSIS — R000 Tachycardia, unspecified: Secondary | ICD-10-CM | POA: Insufficient documentation

## 2022-11-29 DIAGNOSIS — R002 Palpitations: Secondary | ICD-10-CM

## 2022-11-29 DIAGNOSIS — R079 Chest pain, unspecified: Secondary | ICD-10-CM | POA: Insufficient documentation

## 2022-11-29 DIAGNOSIS — D72829 Elevated white blood cell count, unspecified: Secondary | ICD-10-CM | POA: Insufficient documentation

## 2022-11-29 DIAGNOSIS — R109 Unspecified abdominal pain: Secondary | ICD-10-CM | POA: Insufficient documentation

## 2022-11-29 DIAGNOSIS — I160 Hypertensive urgency: Secondary | ICD-10-CM

## 2022-11-29 DIAGNOSIS — I10 Essential (primary) hypertension: Secondary | ICD-10-CM | POA: Insufficient documentation

## 2022-11-29 DIAGNOSIS — R110 Nausea: Secondary | ICD-10-CM | POA: Insufficient documentation

## 2022-11-29 LAB — URINALYSIS, ROUTINE W REFLEX MICROSCOPIC
Bacteria, UA: NONE SEEN
Glucose, UA: NEGATIVE mg/dL
Ketones, ur: NEGATIVE mg/dL
Nitrite: POSITIVE — AB
Protein, ur: 100 mg/dL — AB
Specific Gravity, Urine: 1.023 (ref 1.005–1.030)
WBC, UA: >50 WBC/hpf (ref 0–5)
pH: 6.5 (ref 5.0–8.0)

## 2022-11-29 LAB — CBC WITH DIFFERENTIAL/PLATELET
Abs Immature Granulocytes: 0.05 10*3/uL (ref 0.00–0.07)
Basophils Absolute: 0.2 10*3/uL — ABNORMAL HIGH (ref 0.0–0.1)
Basophils Relative: 1 %
Eosinophils Absolute: 0.1 10*3/uL (ref 0.0–0.5)
Eosinophils Relative: 1 %
HCT: 44.3 % (ref 36.0–46.0)
Hemoglobin: 15.8 g/dL — ABNORMAL HIGH (ref 12.0–15.0)
Immature Granulocytes: 0 %
Lymphocytes Relative: 24 %
Lymphs Abs: 3.6 10*3/uL (ref 0.7–4.0)
MCH: 33.3 pg (ref 26.0–34.0)
MCHC: 35.7 g/dL (ref 30.0–36.0)
MCV: 93.3 fL (ref 80.0–100.0)
Monocytes Absolute: 0.8 10*3/uL (ref 0.1–1.0)
Monocytes Relative: 5 %
Neutro Abs: 10.2 10*3/uL — ABNORMAL HIGH (ref 1.7–7.7)
Neutrophils Relative %: 69 %
Platelets: 509 10*3/uL — ABNORMAL HIGH (ref 150–400)
RBC: 4.75 MIL/uL (ref 3.87–5.11)
RDW: 12.5 % (ref 11.5–15.5)
WBC: 14.9 10*3/uL — ABNORMAL HIGH (ref 4.0–10.5)
nRBC: 0 % (ref 0.0–0.2)

## 2022-11-29 LAB — BASIC METABOLIC PANEL
Anion gap: 19 — ABNORMAL HIGH (ref 5–15)
BUN: 8 mg/dL (ref 6–20)
CO2: 23 mmol/L (ref 22–32)
Calcium: 9.5 mg/dL (ref 8.9–10.3)
Chloride: 95 mmol/L — ABNORMAL LOW (ref 98–111)
Creatinine, Ser: 0.6 mg/dL (ref 0.44–1.00)
GFR, Estimated: >60 mL/min (ref >60)
Glucose, Bld: 132 mg/dL — ABNORMAL HIGH (ref 70–99)
Potassium: 3.2 mmol/L — ABNORMAL LOW (ref 3.5–5.1)
Sodium: 137 mmol/L (ref 135–145)

## 2022-11-29 LAB — HEPATIC FUNCTION PANEL
ALT: 37 U/L (ref 0–44)
AST: 39 U/L (ref 15–41)
Albumin: 5.1 g/dL — ABNORMAL HIGH (ref 3.5–5.0)
Alkaline Phosphatase: 85 U/L (ref 38–126)
Bilirubin, Direct: 0.2 mg/dL (ref 0.0–0.2)
Indirect Bilirubin: 0.7 mg/dL (ref 0.3–0.9)
Total Bilirubin: 0.9 mg/dL (ref 0.3–1.2)
Total Protein: 8.2 g/dL — ABNORMAL HIGH (ref 6.5–8.1)

## 2022-11-29 LAB — PREGNANCY, URINE: Preg Test, Ur: NEGATIVE

## 2022-11-29 LAB — LIPASE, BLOOD: Lipase: 41 U/L (ref 11–51)

## 2022-11-29 LAB — TROPONIN I (HIGH SENSITIVITY): Troponin I (High Sensitivity): 5 ng/L (ref <18)

## 2022-11-29 MED ORDER — SODIUM CHLORIDE 0.9 % IV BOLUS
1000.0000 mL | Freq: Once | INTRAVENOUS | Status: AC
  Administered 2022-11-29: 1000 mL via INTRAVENOUS

## 2022-11-29 MED ORDER — METOPROLOL TARTRATE 25 MG PO TABS: 25.0000 mg | ORAL_TABLET | Freq: Two times a day (BID) | ORAL | 0 refills | Status: AC

## 2022-11-29 MED ORDER — CLONIDINE HCL 0.1 MG PO TABS
0.2000 mg | ORAL_TABLET | Freq: Once | ORAL | Status: AC
  Administered 2022-11-29: 0.2 mg via ORAL
  Filled 2022-11-29: qty 2

## 2022-11-29 MED ORDER — LORAZEPAM 1 MG PO TABS: 1.0000 mg | ORAL_TABLET | Freq: Four times a day (QID) | ORAL | 0 refills | Status: AC | PRN

## 2022-11-29 MED ORDER — ONDANSETRON HCL 4 MG/2ML IJ SOLN
4.0000 mg | Freq: Once | INTRAMUSCULAR | Status: AC
  Administered 2022-11-29: 4 mg via INTRAVENOUS
  Filled 2022-11-29: qty 2

## 2022-11-29 MED ORDER — LORAZEPAM 2 MG/ML IJ SOLN
1.0000 mg | Freq: Once | INTRAMUSCULAR | Status: AC
  Administered 2022-11-29: 1 mg via INTRAVENOUS
  Filled 2022-11-29: qty 1

## 2022-11-29 MED ORDER — CEPHALEXIN 500 MG PO CAPS: 500.0000 mg | ORAL_CAPSULE | Freq: Three times a day (TID) | ORAL | 0 refills | Status: AC

## 2022-11-29 MED ORDER — AMLODIPINE BESYLATE 10 MG PO TABS: 10.0000 mg | ORAL_TABLET | Freq: Every day | ORAL | 0 refills | Status: AC

## 2022-11-29 NOTE — ED Triage Notes (Addendum)
Pt states "my heart is beating out of my chest and I am SHOB" Symptoms started yesterday Pt does have a hx of anxiety Admits to drinking earlier last night  Vomiting in triage Hx of HTN out of meds currently

## 2022-11-29 NOTE — ED Provider Notes (Signed)
[place] [facility] AT [facility] Provider Note   CSN: [id number] Arrival date & time: 11/29/22  0036     History  Chief Complaint  Patient presents with   Chest Pain   Shortness of Breath    Gloria Robinson is a 32 y.o. female.  Patient is a 32 year old female with past medical history of hypertension, anxiety.  Patient presenting today with complaints of chest and abdominal discomfort, nausea, and difficulty eating and drinking.  This started earlier today and is worsening.  Patient describes being out of town over the weekend on an ATV expedition.  She reports consuming excessive alcohol over the weekend.  She denies fevers or chills.  She describes her heart racing and feeling as if it is "beating out of her chest".  Patient arrives here markedly hypertensive.  The history is provided by the patient.       Home Medications Prior to Admission medications   Medication Sig Start Date End Date Taking? Authorizing Provider  cloNIDine (CATAPRES) 0.1 MG tablet Take 1 tablet (0.1 mg total) by mouth 2 (two) times daily for 5 days. 02/09/22 02/14/22  Onuoha, Chinwendu V, NP  hydrOXYzine (ATARAX) 10 MG tablet Take 1 tablet (10 mg total) by mouth 3 (three) times daily as needed for anxiety. 02/09/22   Mancel Bale, NP      Allergies    Patient has no known allergies.    Review of Systems   Review of Systems  All other systems reviewed and are negative.   Physical Exam Updated Vital Signs BP (!) 212/164 (BP Location: Right Arm)   Pulse (!) 120   Temp 98.1 F (36.7 C) (Oral)   Resp (!) 26   Ht 5\' 4"  (1.626 m)   Wt 68 kg   LMP 11/15/2022 (Exact Date)   SpO2 96%   BMI 25.75 kg/m  Physical Exam Vitals and nursing note reviewed.  Constitutional:      General: She is not in acute distress.    Appearance: She is well-developed. She is not diaphoretic.  HENT:     Head: Normocephalic and atraumatic.  Cardiovascular:     Rate and Rhythm: Normal rate  and regular rhythm.     Heart sounds: No murmur heard.    No friction rub. No gallop.  Pulmonary:     Effort: Pulmonary effort is normal. No respiratory distress.     Breath sounds: Normal breath sounds. No wheezing.  Abdominal:     General: Bowel sounds are normal. There is no distension.     Palpations: Abdomen is soft.     Tenderness: There is no abdominal tenderness.  Musculoskeletal:        General: Normal range of motion.     Cervical back: Normal range of motion and neck supple.  Skin:    General: Skin is warm and dry.  Neurological:     General: No focal deficit present.     Mental Status: She is alert and oriented to person, place, and time.     ED Results / Procedures / Treatments   Labs (all labs ordered are listed, but only abnormal results are displayed) Labs Reviewed  CBC WITH DIFFERENTIAL/PLATELET - Abnormal; Notable for the following components:      Result Value   WBC 14.9 (*)    Hemoglobin 15.8 (*)    Platelets 509 (*)    Neutro Abs 10.2 (*)    Basophils Absolute 0.2 (*)    All other  components within normal limits  BASIC METABOLIC PANEL - Abnormal; Notable for the following components:   Potassium 3.2 (*)    Chloride 95 (*)    Glucose, Bld 132 (*)    Anion gap 19 (*)    All other components within normal limits  URINALYSIS, ROUTINE W REFLEX MICROSCOPIC  HEPATIC FUNCTION PANEL  LIPASE, BLOOD  PREGNANCY, URINE  TROPONIN I (HIGH SENSITIVITY)    EKG EKG Interpretation  Date/Time:  Monday November 29 2022 00:46:32 EDT Ventricular Rate:  108 PR Interval:  142 QRS Duration: 88 QT Interval:  350 QTC Calculation: 469 R Axis:   92 Text Interpretation: Sinus tachycardia Right atrial enlargement Rightward axis Pulmonary disease pattern Abnormal ECG No previous ECGs available Confirmed by Geoffery Lyons ([id number]) on 11/29/2022 1:02:26 AM  Radiology No results found.  Procedures Procedures    Medications Ordered in ED Medications  sodium chloride 0.9 %  bolus 1,000 mL (has no administration in time range)  ondansetron (ZOFRAN) injection 4 mg (has no administration in time range)  LORazepam (ATIVAN) injection 1 mg (has no administration in time range)    ED Course/ Medical Decision Making/ A&P  Patient presenting here with complaints of elevated blood pressure and anxiety as described in the HPI.  She also describes her heart pounding and nausea.  Patient arrives here markedly hypertensive, but with otherwise stable vital signs.  Physical examination reveals an anxious female with no focal physical findings.  Workup initiated including CBC, metabolic panel, and troponin.  She has a leukocytosis with white count of 14.9, but studies otherwise unremarkable.  Urinalysis is consistent with a urinary tract infection.  Patient was given Zofran and Ativan and is now feeling significantly improved.  Her blood pressure, however did remain elevated at 200 systolic.  For this reason, she was given clonidine with a good response.  Patient seems appropriate for discharge.  I will prescribe amlodipine and metoprolol and have her follow her blood pressures at home.  She needs to follow-up with primary doctor.  UTI will also be treated with Keflex.  She is also requested a small quantity of medication for anxiety.  Final Clinical Impression(s) / ED Diagnoses Final diagnoses:  None    Rx / DC Orders ED Discharge Orders     None         Geoffery Lyons, MD 11/29/22 [phone]

## 2022-11-29 NOTE — Discharge Instructions (Signed)
Begin taking amlodipine and metoprolol for your blood pressure.  Begin taking Keflex for your urinary tract infection.  Take Ativan as prescribed as needed for anxiety.  Keep a record of your blood pressures and follow-up with primary doctor in the next 2 weeks.
